# Patient Record
Sex: Female | Born: 1960 | Race: Asian | Hispanic: No | Marital: Married | Smoking: Never smoker
Health system: Southern US, Community
[De-identification: ages and names within clinical notes are randomized; demographics above are authoritative.]

## PROBLEM LIST (undated history)

## (undated) DIAGNOSIS — E785 Hyperlipidemia, unspecified: Secondary | ICD-10-CM

## (undated) DIAGNOSIS — I10 Essential (primary) hypertension: Secondary | ICD-10-CM

## (undated) HISTORY — DX: Essential (primary) hypertension: I10

## (undated) HISTORY — DX: Hyperlipidemia, unspecified: E78.5

## (undated) HISTORY — PX: INNER EAR SURGERY: SHX679

---

## 2009-12-19 ENCOUNTER — Ambulatory Visit: Payer: Self-pay | Admitting: Family Medicine

## 2009-12-19 DIAGNOSIS — I10 Essential (primary) hypertension: Secondary | ICD-10-CM

## 2009-12-19 DIAGNOSIS — E785 Hyperlipidemia, unspecified: Secondary | ICD-10-CM

## 2010-02-07 ENCOUNTER — Ambulatory Visit: Admit: 2010-02-07 | Payer: Self-pay | Admitting: Family Medicine

## 2010-03-06 NOTE — Assessment & Plan Note (Signed)
Summary: NOV:HTN, lipids   Vital Signs:  Patient profile:   50 year old female Height:      67 inches Weight:      187 pounds BMI:     29.39 Pulse rate:   70 / minute BP sitting:   133 / 94  (right arm) Cuff size:   regular  Vitals Entered By: Avon Gully CMA, Duncan Dull) (December 19, 2009 1:52 PM) CC: NP-est care, Hypertension Management   CC:  NP-est care and Hypertension Management.  History of Present Illness: Here to estab care and f/u on her HTN. Says she had labwork done about 2-3 months ago. Usually gets it every 6 mohths. Usually goes to [facility] for her care.   Hypertension History:      She denies headache, chest pain, palpitations, dyspnea with exertion, orthopnea, PND, peripheral edema, visual symptoms, neurologic problems, syncope, and side effects from treatment.  She notes no problems with any antihypertensive medication side effects.        Positive major cardiovascular risk factors include hyperlipidemia and hypertension.  Negative major cardiovascular risk factors include female age less than 78 years old and non-tobacco-user status.     Habits & Providers  Alcohol-Tobacco-Diet     Alcohol drinks/day: 0     Tobacco Status: never  Exercise-Depression-Behavior     Drug Use: no     Seat Belt Use: always  Current Medications (verified): 1)  Simvastatin 20 Mg Tabs (Simvastatin) .... Take One Tablet By Mouth Once A Day 2)  Lisinopril-Hydrochlorothiazide 20-12.5 Mg Tabs (Lisinopril-Hydrochlorothiazide) .... Take One Tablet By Mouth Once A Day  Allergies (verified): No Known Drug Allergies  Comments:  Nurse/Medical Assistant: The patient's medications and allergies were reviewed with the patient and were updated in the Medication and Allergy Lists. Avon Gully CMA, Duncan Dull) (December 19, 2009 1:55 PM)  Past History:  Past Medical History: None  Past Surgical History: None  Family History: Momwith HTN.   Social History: Never  Smoked Alcohol use-no Drug use-no Smoking Status:  never Drug Use:  no Seat Belt Use:  always  Review of Systems       No fever/sweats/weakness, unexplained weight loss/gain.  No vison changes.  No difficulty hearing/ringing in ears, hay fever/allergies.  No chest pain/discomfort, palpitations.  No Br lump/nipple discharge.  No cough/wheeze.  No blood in BM, nausea/vomiting/diarrhea.  No nighttime urination, leaking urine, unusual vaginal bleeding, discharge (penis or vagina).  No muscle/joint pain. No rash, change in mole.  No HA, memory loss.  No anxiety, sleep d/o, depression.  No easy bruising/bleeding, unexplained lump   Contraindications/Deferment of Procedures/Staging:    Treatment: Flu Shot    Contraindication: adverse rxn/side effects   Physical Exam  General:  Well-developed,well-nourished,in no acute distress; alert,appropriate and cooperative throughout examination Head:  Normocephalic and atraumatic without obvious abnormalities. No apparent alopecia or balding. Neck:  No deformities, masses, or tenderness noted. NO T.  Lungs:  Normal respiratory effort, chest expands symmetrically. Lungs are clear to auscultation, no crackles or wheezes. Heart:  Normal rate and regular rhythm. S1 and S2 normal without gallop, murmur, click, rub or other extra sounds. No carotid bruits.  Pulses:  RAdial 2+  Extremities:  NO Le edema.  Skin:  no rashes.   Cervical Nodes:  No lymphadenopathy noted Psych:  Cognition and judgment appear intact. Alert and cooperative with normal attention span and concentration. No apparent delusions, illusions, hallucinations   Impression & Recommendations:  Problem # 1:  HYPERTENSION, BENIGN (ICD-401.1) AT goal  today.  Her updated medication list for this problem includes:    Lisinopril-hydrochlorothiazide 20-12.5 Mg Tabs (Lisinopril-hydrochlorothiazide) .Marland Kitchen... Take one tablet by mouth once a day  Orders: T-Comprehensive Metabolic Panel  ([id number])  BP today: 133/94  10 Yr Risk Heart Disease: Not enough information  Problem # 2:  HYPERLIPIDEMIA (ICD-272.4) Tolerating well. Can get labs at f/u for CPE in [place].  Her updated medication list for this problem includes:    Simvastatin 20 Mg Tabs (Simvastatin) .Marland Kitchen... Take one tablet by mouth once a day  Orders: T-Lipid Profile ([id number])  Complete Medication List: 1)  Simvastatin 20 Mg Tabs (Simvastatin) .... Take one tablet by mouth once a day 2)  Lisinopril-hydrochlorothiazide 20-12.5 Mg Tabs (Lisinopril-hydrochlorothiazide) .... Take one tablet by mouth once a day  Other Orders: Flu Vaccine 80yrs + (62952) Admin 1st Vaccine (84132)  Hypertension Assessment/Plan:      The patient's hypertensive risk group is category B: At least one risk factor (excluding diabetes) with no target organ damage.  Today's blood pressure is 133/94.  Her blood pressure goal is < 140/90.  Patient Instructions: 1)  Please schedule a physical and pap smear in January. We will do fasting labs at that time.  You were given a Tdap today.    Orders Added: 1)  T-Comprehensive Metabolic Panel [[id number]] 2)  T-Lipid Profile [[id number]] 3)  New Patient Level III [44010] 4)  Flu Vaccine 39yrs + [90658] 5)  Admin 1st Vaccine [90471]   Immunizations Administered:  Influenza Vaccine # 1:    Vaccine Type: Fluvax 3+    Site: left deltoid    Mfr: GlaxoSmithKline    Dose: 0.5 ml    Route: IM    Given by: Sue Lush McCrimmon CMA, (AAMA)    Exp. Date: 08/04/2010    Lot #: [id number]    VIS given: 08/29/09 version given December 20, 2009.  Flu Vaccine Consent Questions:    Do you have a history of severe allergic reactions to this vaccine? no    Any prior history of allergic reactions to egg and/or gelatin? no    Do you have a sensitivity to the preservative Thimersol? no    Do you have a past history of Guillan-Barre Syndrome? no    Do you currently have an acute febrile illness? no     Have you ever had a severe reaction to latex? no    Vaccine information given and explained to patient? no    Are you currently pregnant? no   Immunizations Administered:  Influenza Vaccine # 1:    Vaccine Type: Fluvax 3+    Site: left deltoid    Mfr: GlaxoSmithKline    Dose: 0.5 ml    Route: IM    Given by: Sue Lush McCrimmon CMA, (AAMA)    Exp. Date: 08/04/2010    Lot #: [id number]    VIS given: 08/29/09 version given December 20, 2009.  Flu Vaccine Next Due:  Not Indicated Mammogram Result Date:  02/19/2009 Mammogram Result:  normal Mammogram Next Due:  1 yr

## 2012-01-27 LAB — BASIC METABOLIC PANEL
Creatinine: 0.7 mg/dL (ref 0.5–1.1)
Glucose: 87 mg/dL
Potassium: 4.2 mmol/L (ref 3.4–5.3)

## 2012-01-27 LAB — CBC AND DIFFERENTIAL: WBC: 7.1 10^3/mL

## 2012-01-27 LAB — HEPATIC FUNCTION PANEL
ALT: 18 U/L (ref 7–35)
AST: 19 U/L (ref 13–35)

## 2012-03-02 ENCOUNTER — Encounter: Payer: Self-pay | Admitting: Physician Assistant

## 2012-03-02 ENCOUNTER — Ambulatory Visit (INDEPENDENT_AMBULATORY_CARE_PROVIDER_SITE_OTHER): Payer: 59 | Admitting: Physician Assistant

## 2012-03-02 VITALS — BP 118/74 | HR 77 | Resp 16 | Ht 66.5 in | Wt 192.0 lb

## 2012-03-02 DIAGNOSIS — T464X5A Adverse effect of angiotensin-converting-enzyme inhibitors, initial encounter: Secondary | ICD-10-CM

## 2012-03-02 DIAGNOSIS — R05 Cough: Secondary | ICD-10-CM

## 2012-03-02 DIAGNOSIS — Z0000 Encounter for general adult medical examination without abnormal findings: Secondary | ICD-10-CM

## 2012-03-02 DIAGNOSIS — E785 Hyperlipidemia, unspecified: Secondary | ICD-10-CM

## 2012-03-02 MED ORDER — LOSARTAN POTASSIUM-HCTZ 50-12.5 MG PO TABS: 1.0000 | ORAL_TABLET | Freq: Every day | ORAL | Status: DC

## 2012-03-02 NOTE — Patient Instructions (Addendum)
Start new blood pressure medication. Follow up in 3 weeks to recheck BP and make sure cough has gone away.   Will call with stress test appt.  1.5 Gram Low Sodium Diet A 1.5 gram sodium diet restricts the amount of sodium in the diet to no more than 1.5 g or 1500 mg daily. The American Heart Association recommends Americans over the age of 49 to consume no more than 1500 mg of sodium each day to reduce the risk of developing high blood pressure. Research also shows that limiting sodium may reduce heart attack and stroke risk. Many foods contain sodium for flavor and sometimes as a preservative. When the amount of sodium in a diet needs to be low, it is important to know what to look for when choosing foods and drinks. The following includes some information and guidelines to help make it easier for you to adapt to a low sodium diet. QUICK TIPS  Do not add salt to food.  Avoid convenience items and fast food.  Choose unsalted snack foods.  Buy lower sodium products, often labeled as "lower sodium" or "no salt added."  Check food labels to learn how much sodium is in 1 serving.  When eating at a restaurant, ask that your food be prepared with less salt or none, if possible. READING FOOD LABELS FOR SODIUM INFORMATION The nutrition facts label is a good place to find how much sodium is in foods. Look for products with no more than 400 mg of sodium per serving. Remember that 1.5 g = 1500 mg. The food label may also list foods as:  Sodium-free: Less than 5 mg in a serving.  Very low sodium: 35 mg or less in a serving.  Low-sodium: 140 mg or less in a serving.  Light in sodium: 50% less sodium in a serving. For example, if a food that usually has 300 mg of sodium is changed to become light in sodium, it will have 150 mg of sodium.  Reduced sodium: 25% less sodium in a serving. For example, if a food that usually has 400 mg of sodium is changed to reduced sodium, it will have 300 mg of  sodium. CHOOSING FOODS Grains  Avoid: Salted crackers and snack items. Some cereals, including instant hot cereals. Bread stuffing and biscuit mixes. Seasoned rice or pasta mixes.  Choose: Unsalted snack items. Low-sodium cereals, oats, puffed wheat and rice, shredded wheat. English muffins and bread. Pasta. Meats  Avoid: Salted, canned, smoked, spiced, pickled meats, including fish and poultry. Bacon, ham, sausage, cold cuts, hot dogs, anchovies.  Choose: Low-sodium canned tuna and salmon. Fresh or frozen meat, poultry, and fish. Dairy  Avoid: Processed cheese and spreads. Cottage cheese. Buttermilk and condensed milk. Regular cheese.  Choose: Milk. Low-sodium cottage cheese. Yogurt. Sour cream. Low-sodium cheese. Fruits and Vegetables  Avoid: Regular canned vegetables. Regular canned tomato sauce and paste. Frozen vegetables in sauces. Olives. Rosita Fire. Relishes. Sauerkraut.  Choose: Low-sodium canned vegetables. Low-sodium tomato sauce and paste. Frozen or fresh vegetables. Fresh and frozen fruit. Condiments  Avoid: Canned and packaged gravies. Worcestershire sauce. Tartar sauce. Barbecue sauce. Soy sauce. Steak sauce. Ketchup. Onion, garlic, and table salt. Meat flavorings and tenderizers.  Choose: Fresh and dried herbs and spices. Low-sodium varieties of mustard and ketchup. Lemon juice. Tabasco sauce. Horseradish. SAMPLE 1.5 GRAM SODIUM MEAL PLAN Breakfast / Sodium (mg)  1 cup low-fat milk / 143 mg  1 whole-wheat English muffin / 240 mg  1 tbs heart-healthy margarine / 153  mg  1 hard-boiled egg / 139 mg  1 small orange / 0 mg Lunch / Sodium (mg)  1 cup raw carrots / 76 mg  2 tbs no salt added peanut butter / 5 mg  2 slices whole-wheat bread / 270 mg  1 tbs jelly / 6 mg   cup red grapes / 2 mg Dinner / Sodium (mg)  1 cup whole-wheat pasta / 2 mg  1 cup low-sodium tomato sauce / 73 mg  3 oz lean ground beef / 57 mg  1 small side salad (1 cup raw spinach  leaves,  cup cucumber,  cup yellow bell pepper) with 1 tsp olive oil and 1 tsp red wine vinegar / 25 mg Snack / Sodium (mg)  1 container low-fat vanilla yogurt / 107 mg  3 graham cracker squares / 127 mg Nutrient Analysis  Calories: 1745  Protein: 75 g  Carbohydrate: 237 g  Fat: 57 g  Sodium: 1425 mg Document Released: 01/21/2005 Document Revised: 04/15/2011 Document Reviewed: 04/24/2009 [facility] Patient Information 2013 [place], [place].

## 2012-03-02 NOTE — Progress Notes (Signed)
  Subjective:    Patient ID: Kelly Medina, female    DOB: 1960-05-05, 52 y.o.   MRN: [id number]  HPI Patient is a 52 year old female who presents to the clinic to establish care today. Past medical history was reviewed and positive for hypertension and hyperlipidemia.  Hypertension is ongoing but well controlled on Lisinopril/HCTZ. She has a ongoing cough for over a year but never mentioned it to anybody until today. Very dry not productive. She denies any CP, palpitations, HA's, vision changes.   Hyperlipidemia is ongoing and per patient controlled on Zocor. She had her blood drawn 2 weeks ago at [facility]. She tries to keep a low fat diet. She denies any current exercise.   Today she would like to be released for exercise. She denies any chest tightness, numbness and tingling down arms. [employer] is requesting free stress test. She would like to get.  Pap smear done 2 weeks ago. Mammogram scheduled for March 2014. Colonoscopy done 3 years ago and due every 5 years. All vaccines up to date.    Review of Systems     Objective:   Physical Exam  Constitutional: She is oriented to person, place, and time. She appears well-developed and well-nourished.  HENT:  Head: Normocephalic and atraumatic.  Cardiovascular: Normal rate, regular rhythm and normal heart sounds.   Pulmonary/Chest: Effort normal and breath sounds normal. She has no wheezes.  Neurological: She is alert and oriented to person, place, and time.  Skin: Skin is warm and dry.  Psychiatric: She has a normal mood and affect. Her behavior is normal.          Assessment & Plan:  Cough- I suspect the cough is coming as a result of lisinopril. Patient was instructed to stop lisinopril HCTZ and start Hyzaar once daily. Will recheck blood pressure in 2-3 weeks as well as resolution of cough. Patient reminded of low salt diet.  Hyperlipidemia-we'll wait to get records to confirm well controlled. In the meantime continue on Zocor.  Patient  is requesting stress test [employer] will pay for one particular stress test. Ordered stress test.

## 2012-03-18 ENCOUNTER — Encounter: Payer: 59 | Admitting: Physician Assistant

## 2012-03-27 ENCOUNTER — Encounter: Payer: Self-pay | Admitting: Physician Assistant

## 2012-03-27 ENCOUNTER — Ambulatory Visit (INDEPENDENT_AMBULATORY_CARE_PROVIDER_SITE_OTHER): Payer: 59 | Admitting: Physician Assistant

## 2012-03-27 VITALS — BP 113/74 | HR 77 | Wt 194.0 lb

## 2012-03-27 DIAGNOSIS — Z0189 Encounter for other specified special examinations: Secondary | ICD-10-CM

## 2012-03-27 DIAGNOSIS — R319 Hematuria, unspecified: Secondary | ICD-10-CM

## 2012-03-27 LAB — POCT URINALYSIS DIPSTICK
Bilirubin, UA: NEGATIVE
Ketones, UA: NEGATIVE
Leukocytes, UA: NEGATIVE
Spec Grav, UA: 1.025

## 2012-03-27 NOTE — Progress Notes (Signed)
  Subjective:    Patient ID: Kelly Medina, female    DOB: 1960/04/06, 52 y.o.   MRN: [id number]  HPI Patient comes to clinic to have paperwork filled out so that she can start exercising with [employer]ness program. No problems or concerns today.    Review of Systems  All other systems reviewed and are negative.       Objective:   Physical Exam  Constitutional: She is oriented to person, place, and time. She appears well-developed and well-nourished.  HENT:  Head: Normocephalic and atraumatic.  Cardiovascular: Normal rate, regular rhythm and normal heart sounds.   Pulmonary/Chest: Effort normal and breath sounds normal.  Neurological: She is alert and oriented to person, place, and time.  Skin: Skin is warm and dry.  Psychiatric: She has a normal mood and affect. Her behavior is normal.          Assessment & Plan:  Form to fill out/hematuria- UA found some blood in urine. Discussed need to follow up in 1 month. Denies any symptoms. I want to make sure resolves. Gave lab slip to get fasting bloodwork and labs requested by [facility]. Pt feels like she has had these done and is going to see if OB/GYN has done them if so will give [place] a copy. Will send stress test after completed.

## 2012-03-27 NOTE — Patient Instructions (Signed)
Need to get labs from other office or get labs drawn here to send into clinic. We would like copy for ourselves.

## 2012-04-03 ENCOUNTER — Ambulatory Visit (INDEPENDENT_AMBULATORY_CARE_PROVIDER_SITE_OTHER): Payer: 59 | Admitting: Physician Assistant

## 2012-04-03 NOTE — Procedures (Signed)
Kelly Medina is a 52 y.o. female with hx of HTN, HL for ETT.  She wants to start exercise program. No hx of tobacco. No FHx of CAD.  No hx of CP, SOB, syncope.  Exam unremarkable.   Exercise Treadmill Test  Pre-Exercise Testing Evaluation Rhythm: normal sinus  Rate: 101     Test  Exercise Tolerance Test Ordering MD: Nani Gasser MD  Interpreting MD: Tereso Newcomer PA-C  Unique Test No: 1  Treadmill:  1  Indication for ETT: htn  Contraindication to ETT: No   Stress Modality: exercise - treadmill  Cardiac Imaging Performed: non   Protocol: standard Bruce - maximal  Max BP:  165/103  Max MPHR (bpm):  168 85% MPR (bpm):  143  MPHR obtained (bpm):  166 % MPHR obtained:  99%  Reached 85% MPHR (min:sec):  6:15 Total Exercise Time (min-sec):  9:00  Workload in METS:  10.1 Borg Scale: 15  Reason ETT Terminated:  desired heart rate attained    ST Segment Analysis At Rest: non-specific ST segment slurring With Exercise: non-specific ST changes  Other Information Arrhythmia:  No Angina during ETT:  absent (0) Quality of ETT:  diagnostic  ETT Interpretation:  normal - no evidence of ischemia by ST analysis  Comments: Good exercise tolerance. No chest pain. Hypertensive BP response to exercise. No significant ST-T changes to suggest ischemia. Good HR recovery in 1st minute post exercise (HR dropped > 12 bpm at 2 mph on 2% grade).  Recommendations: Follow up with PCP as planned. Luna Glasgow, PA-C  9:39 AM 04/03/2012

## 2012-04-07 ENCOUNTER — Encounter: Payer: Self-pay | Admitting: *Deleted

## 2012-04-27 ENCOUNTER — Encounter: Payer: Self-pay | Admitting: *Deleted

## 2012-05-06 ENCOUNTER — Telehealth: Payer: Self-pay | Admitting: Physician Assistant

## 2012-05-06 ENCOUNTER — Ambulatory Visit (INDEPENDENT_AMBULATORY_CARE_PROVIDER_SITE_OTHER): Payer: 59 | Admitting: Physician Assistant

## 2012-05-06 ENCOUNTER — Encounter: Payer: Self-pay | Admitting: Physician Assistant

## 2012-05-06 VITALS — BP 136/96 | HR 71 | Wt 191.0 lb

## 2012-05-06 DIAGNOSIS — I10 Essential (primary) hypertension: Secondary | ICD-10-CM

## 2012-05-06 DIAGNOSIS — E785 Hyperlipidemia, unspecified: Secondary | ICD-10-CM

## 2012-05-06 DIAGNOSIS — R928 Other abnormal and inconclusive findings on diagnostic imaging of breast: Secondary | ICD-10-CM

## 2012-05-06 DIAGNOSIS — R319 Hematuria, unspecified: Secondary | ICD-10-CM

## 2012-05-06 LAB — CBC WITH DIFFERENTIAL/PLATELET
Eosinophils Absolute: 0.1 10*3/uL (ref 0.0–0.7)
HCT: 40.9 % (ref 36.0–46.0)
Hemoglobin: 14.1 g/dL (ref 12.0–15.0)
Lymphs Abs: 1.8 10*3/uL (ref 0.7–4.0)
MCH: 29.7 pg (ref 26.0–34.0)
Monocytes Absolute: 0.3 10*3/uL (ref 0.1–1.0)
Monocytes Relative: 5 % (ref 3–12)
Neutro Abs: 3.5 10*3/uL (ref 1.7–7.7)
Neutrophils Relative %: 63 % (ref 43–77)
RBC: 4.74 MIL/uL (ref 3.87–5.11)

## 2012-05-06 LAB — COMPREHENSIVE METABOLIC PANEL
ALT: 23 U/L (ref 0–35)
AST: 20 U/L (ref 0–37)
Alkaline Phosphatase: 65 U/L (ref 39–117)
BUN: 23 mg/dL (ref 6–23)
Calcium: 9.3 mg/dL (ref 8.4–10.5)
Chloride: 107 mEq/L (ref 96–112)
Creat: 0.76 mg/dL (ref 0.50–1.10)
Total Bilirubin: 0.5 mg/dL (ref 0.3–1.2)

## 2012-05-06 LAB — LIPID PANEL
Cholesterol: 185 mg/dL (ref 0–200)
HDL: 40 mg/dL (ref >39)
LDL Cholesterol: 123 mg/dL — ABNORMAL HIGH (ref 0–99)
Total CHOL/HDL Ratio: 4.6 Ratio
Triglycerides: 112 mg/dL (ref <150)
VLDL: 22 mg/dL (ref 0–40)

## 2012-05-06 LAB — POCT URINALYSIS DIPSTICK
Glucose, UA: NEGATIVE
Leukocytes, UA: NEGATIVE
Nitrite, UA: NEGATIVE
Urobilinogen, UA: 0.2

## 2012-05-06 NOTE — Telephone Encounter (Signed)
Please call pt: got results of breast ultrasound. Impression was suggestive of sebaceous cyst. If continues to enlarge or become painful can undergo surgical excision. If not bothering you then will continue with yearly screening mammograms.

## 2012-05-06 NOTE — Progress Notes (Signed)
  Subjective:    Patient ID: Kelly Medina, female    DOB: 1960/08/17, 52 y.o.   MRN: [id number]  HPI Patient presents to clinic to follow up on hematuria that was found one month ago. Denies any symptoms of dysuria, urgency, frequency.   Hypertension is controlled. Did not take meds this morning. Takes medication regularly. Denies any CP, palpitations, vision changes, HA's. Has not started exercise plan at [facility] yet. Plans to soon. Sticks to a low salt diet.  Hyperlipidemia uncontrolled currently. Started medication. Needs recheck. Trying to adhere to low fat diet.   Had abnormal right breast mammogram and ultrasound. Pt is very unsure of next step. She has not heard from imaging. She wonders about need for biopsy. Denies any pain or symptoms.   Review of Systems     Objective:   Physical Exam  Constitutional: She is oriented to person, place, and time. She appears well-developed and well-nourished.  HENT:  Head: Normocephalic and atraumatic.  Cardiovascular: Normal rate, regular rhythm and normal heart sounds.   Pulmonary/Chest: Effort normal and breath sounds normal.  Neurological: She is alert and oriented to person, place, and time.  Skin: Skin is warm and dry.  Psychiatric: She has a normal mood and affect. Her behavior is normal.          Assessment & Plan:  Hematuria- Rechecked Urine and was negative. Reassured pt that normal variant.   Hyperlipidemia- Needs refill on zocor. I want to get labs before refill. Discussed continue low fat diet.   HTN- Pt reports does not need refill. She was not taking medication today since fasting for bloodwork. Reminded of low salt diet. Follow up in 6 months.   Abnormal mammogram- right breast abnormality. Pt had follow up with ultrasound and she had done. She questioned biospy. We will call imaging center and see if recommend biopsy.

## 2012-05-06 NOTE — Telephone Encounter (Signed)
Pt notified & voiced understanding.

## 2012-05-06 NOTE — Patient Instructions (Signed)
Will call and see what imaging of breast [facility] recommends.   Will call with results and refill medication at that time.

## 2012-05-07 ENCOUNTER — Other Ambulatory Visit: Payer: Self-pay | Admitting: Physician Assistant

## 2012-05-07 ENCOUNTER — Encounter: Payer: Self-pay | Admitting: *Deleted

## 2012-05-07 MED ORDER — SIMVASTATIN 40 MG PO TABS: 40.0000 mg | ORAL_TABLET | Freq: Every evening | ORAL | Status: DC

## 2012-11-06 ENCOUNTER — Other Ambulatory Visit: Payer: Self-pay | Admitting: Physician Assistant

## 2012-11-09 ENCOUNTER — Other Ambulatory Visit: Payer: Self-pay | Admitting: Physician Assistant

## 2012-12-07 ENCOUNTER — Ambulatory Visit (INDEPENDENT_AMBULATORY_CARE_PROVIDER_SITE_OTHER): Payer: 59 | Admitting: Physician Assistant

## 2012-12-07 ENCOUNTER — Encounter: Payer: Self-pay | Admitting: Physician Assistant

## 2012-12-07 VITALS — BP 143/90 | HR 64 | Wt 192.0 lb

## 2012-12-07 DIAGNOSIS — Z780 Asymptomatic menopausal state: Secondary | ICD-10-CM

## 2012-12-07 DIAGNOSIS — Z131 Encounter for screening for diabetes mellitus: Secondary | ICD-10-CM

## 2012-12-07 DIAGNOSIS — Z13820 Encounter for screening for osteoporosis: Secondary | ICD-10-CM

## 2012-12-07 DIAGNOSIS — Z0000 Encounter for general adult medical examination without abnormal findings: Secondary | ICD-10-CM

## 2012-12-07 DIAGNOSIS — I10 Essential (primary) hypertension: Secondary | ICD-10-CM

## 2012-12-07 DIAGNOSIS — E785 Hyperlipidemia, unspecified: Secondary | ICD-10-CM

## 2012-12-07 LAB — COMPLETE METABOLIC PANEL WITH GFR
ALT: 19 U/L (ref 0–35)
AST: 18 U/L (ref 0–37)
Albumin: 4.6 g/dL (ref 3.5–5.2)
Alkaline Phosphatase: 84 U/L (ref 39–117)
CO2: 28 mEq/L (ref 19–32)
Creat: 0.68 mg/dL (ref 0.50–1.10)
GFR, Est Non African American: >89 mL/min
Glucose, Bld: 83 mg/dL (ref 70–99)
Potassium: 4 mEq/L (ref 3.5–5.3)
Sodium: 142 mEq/L (ref 135–145)
Total Protein: 7.8 g/dL (ref 6.0–8.3)

## 2012-12-07 LAB — LIPID PANEL
Cholesterol: 202 mg/dL — ABNORMAL HIGH (ref 0–200)
HDL: 52 mg/dL (ref >39)
LDL Cholesterol: 134 mg/dL — ABNORMAL HIGH (ref 0–99)
Total CHOL/HDL Ratio: 3.9 Ratio
VLDL: 16 mg/dL (ref 0–40)

## 2012-12-07 MED ORDER — LOSARTAN POTASSIUM-HCTZ 50-12.5 MG PO TABS: 1.0000 | ORAL_TABLET | Freq: Every day | ORAL | Status: DC

## 2012-12-07 MED ORDER — SIMVASTATIN 40 MG PO TABS: 40.0000 mg | ORAL_TABLET | Freq: Every evening | ORAL | Status: DC

## 2012-12-07 NOTE — Progress Notes (Signed)
  Subjective:     Kelly Medina is a 52 y.o. female and is here for a comprehensive physical exam. The patient reports no problems.  History   Social History  . Marital Status: Married    Spouse Name: N/A    Number of Children: N/A  . Years of Education: N/A   Occupational History  . Not on file.   Social History Main Topics  . Smoking status: Never Smoker   . Smokeless tobacco: Never Used  . Alcohol Use: No  . Drug Use: No  . Sexual Activity:    Other Topics Concern  . Not on file   Social History Narrative  . No narrative on file   Health Maintenance  Topic Date Due  . Influenza Vaccine  09/04/2012  . Colonoscopy  03/02/2014  . Mammogram  04/15/2014  . Pap Smear  02/17/2015  . Tetanus/tdap  03/02/2018    The following portions of the patient's history were reviewed and updated as appropriate: allergies, current medications, past family history, past medical history, past social history, past surgical history and problem list.  Review of Systems A comprehensive review of systems was negative.   Objective:    BP 143/90  Pulse 64  Wt 192 lb (87.091 kg) General appearance: alert, cooperative and appears stated age Head: Normocephalic, without obvious abnormality, atraumatic Eyes: conjunctivae/corneas clear. PERRL, EOM's intact. Fundi benign. Ears: normal TM's and external ear canals both ears Nose: Nares normal. Septum midline. Mucosa normal. No drainage or sinus tenderness. Throat: lips, mucosa, and tongue normal; teeth and gums normal Neck: no adenopathy, no carotid bruit, no JVD, supple, symmetrical, trachea midline and thyroid not enlarged, symmetric, no tenderness/mass/nodules Back: symmetric, no curvature. ROM normal. No CVA tenderness. Lungs: clear to auscultation bilaterally Heart: regular rate and rhythm, S1, S2 normal, no murmur, click, rub or gallop Abdomen: soft, non-tender; bowel sounds normal; no masses,  no organomegaly Extremities: extremities  normal, atraumatic, no cyanosis or edema Pulses: 2+ and symmetric Skin: Skin color, texture, turgor normal. No rashes or lesions Lymph nodes: Cervical, supraclavicular, and axillary nodes normal. Neurologic: Grossly normal    Assessment:    Healthy female exam.      Plan:    CPE for [employer] plan- check fasting lipids and cholesterol today. Patient has lost only 2 pounds since starting regimen 6 months ago. Patient is aware that she has to work out more than she currently is. We set goals to day of working out 3-4 times a week, low-fat diet. Patient request a bone density scan today. I discussed with patient that is not currently indicated until she 79 if she has no other caustics issues. Pt still wanted. Will order.filled out paperwork today. Fall risk low. Depression PHQ-9 was 0 out of 2.   Refilled zocor and hyzzar.  Pt declined flu shot.  See After Visit Summary for Counseling Recommendations

## 2013-07-09 ENCOUNTER — Ambulatory Visit (INDEPENDENT_AMBULATORY_CARE_PROVIDER_SITE_OTHER): Payer: Commercial Managed Care - PPO | Admitting: Physician Assistant

## 2013-07-09 ENCOUNTER — Ambulatory Visit (INDEPENDENT_AMBULATORY_CARE_PROVIDER_SITE_OTHER): Payer: Commercial Managed Care - PPO

## 2013-07-09 ENCOUNTER — Encounter: Payer: Self-pay | Admitting: Physician Assistant

## 2013-07-09 ENCOUNTER — Other Ambulatory Visit: Payer: Self-pay | Admitting: Physician Assistant

## 2013-07-09 VITALS — BP 132/91 | HR 67 | Ht 67.0 in | Wt 193.0 lb

## 2013-07-09 DIAGNOSIS — M20009 Unspecified deformity of unspecified finger(s): Secondary | ICD-10-CM

## 2013-07-09 DIAGNOSIS — R635 Abnormal weight gain: Secondary | ICD-10-CM

## 2013-07-09 DIAGNOSIS — Z09 Encounter for follow-up examination after completed treatment for conditions other than malignant neoplasm: Secondary | ICD-10-CM

## 2013-07-09 DIAGNOSIS — M898X9 Other specified disorders of bone, unspecified site: Secondary | ICD-10-CM

## 2013-07-09 DIAGNOSIS — E669 Obesity, unspecified: Secondary | ICD-10-CM

## 2013-07-09 DIAGNOSIS — E785 Hyperlipidemia, unspecified: Secondary | ICD-10-CM

## 2013-07-09 DIAGNOSIS — Z1231 Encounter for screening mammogram for malignant neoplasm of breast: Secondary | ICD-10-CM

## 2013-07-09 DIAGNOSIS — M159 Polyosteoarthritis, unspecified: Secondary | ICD-10-CM

## 2013-07-09 DIAGNOSIS — M19049 Primary osteoarthritis, unspecified hand: Secondary | ICD-10-CM

## 2013-07-09 LAB — LIPID PANEL
Cholesterol: 181 mg/dL (ref 0–200)
HDL: 41 mg/dL (ref >39)
LDL CALC: 120 mg/dL — AB (ref 0–99)
Total CHOL/HDL Ratio: 4.4 Ratio
Triglycerides: 100 mg/dL (ref <150)
VLDL: 20 mg/dL (ref 0–40)

## 2013-07-09 LAB — GLUCOSE, RANDOM: Glucose, Bld: 79 mg/dL (ref 70–99)

## 2013-07-09 MED ORDER — LOSARTAN POTASSIUM-HCTZ 50-12.5 MG PO TABS: 1.0000 | ORAL_TABLET | Freq: Every day | ORAL | Status: AC

## 2013-07-09 MED ORDER — SIMVASTATIN 40 MG PO TABS: 40.0000 mg | ORAL_TABLET | Freq: Every evening | ORAL | Status: AC

## 2013-07-09 MED ORDER — PHENTERMINE HCL 37.5 MG PO TABS: 37.5000 mg | ORAL_TABLET | Freq: Every day | ORAL | Status: AC

## 2013-07-11 DIAGNOSIS — R635 Abnormal weight gain: Secondary | ICD-10-CM | POA: Insufficient documentation

## 2013-07-11 DIAGNOSIS — E669 Obesity, unspecified: Secondary | ICD-10-CM | POA: Insufficient documentation

## 2013-07-11 DIAGNOSIS — M19049 Primary osteoarthritis, unspecified hand: Secondary | ICD-10-CM | POA: Insufficient documentation

## 2013-07-11 NOTE — Progress Notes (Signed)
   Subjective:    Patient ID: Kelly Medina, female    DOB: 1960/11/23, 53 y.o.   MRN: [id number]  HPI Pt comes in to have 6 month recheck for her fitness regimen at the [facility]. She is trying to lose weight and stay healthy. In order to stay in program has to get follow ups. She is currently up 1 pound. She goes to the gym 2-3 times a week. Admits to not working out as much as she should. She denies making any real diet changes. Would like to try something to help lose weight.   Nodules on bilateral tip of fingers. No treatment. No pain. No discomfort. No Loss of motion.    Review of Systems  All other systems reviewed and are negative.      Objective:   Physical Exam  Constitutional: She is oriented to person, place, and time. She appears well-developed and well-nourished.  HENT:  Head: Normocephalic and atraumatic.  Cardiovascular: Normal rate, regular rhythm and normal heart sounds.   Pulmonary/Chest: Effort normal. She has no wheezes.  Musculoskeletal:  Non tender nodules on bilateral hand DIP joints of all metatarsals.   Neurological: She is alert and oriented to person, place, and time.  Skin: Skin is dry.  Psychiatric: She has a normal mood and affect. Her behavior is normal.          Assessment & Plan:  Obese/abnormal weight gain- ordered labs for [facility] program she is in. Started phentermine. Discussed side effects. Pt aware only as good as diet and exercise along with it. Discussed tracking 1200 calorie diet and increasing exercise to 4 times a week. Follow up in one month. Would like for her to continue in problem.   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 Not painful currently. No treatment. Dicussed keeping joints moving. NSAIDs as needed for pain. Follow up as needed.

## 2013-07-13 ENCOUNTER — Ambulatory Visit: Payer: 59

## 2013-07-15 ENCOUNTER — Ambulatory Visit (INDEPENDENT_AMBULATORY_CARE_PROVIDER_SITE_OTHER): Payer: Commercial Managed Care - PPO

## 2013-07-15 DIAGNOSIS — R928 Other abnormal and inconclusive findings on diagnostic imaging of breast: Secondary | ICD-10-CM

## 2013-07-15 DIAGNOSIS — Z1231 Encounter for screening mammogram for malignant neoplasm of breast: Secondary | ICD-10-CM

## 2013-07-16 ENCOUNTER — Other Ambulatory Visit: Payer: Self-pay | Admitting: Physician Assistant

## 2013-07-16 DIAGNOSIS — R928 Other abnormal and inconclusive findings on diagnostic imaging of breast: Secondary | ICD-10-CM

## 2013-07-19 ENCOUNTER — Other Ambulatory Visit: Payer: Self-pay | Admitting: Physician Assistant

## 2013-07-19 ENCOUNTER — Other Ambulatory Visit: Payer: Self-pay

## 2013-07-19 DIAGNOSIS — R928 Other abnormal and inconclusive findings on diagnostic imaging of breast: Secondary | ICD-10-CM

## 2013-07-23 ENCOUNTER — Other Ambulatory Visit: Payer: Commercial Managed Care - PPO

## 2013-08-16 ENCOUNTER — Telehealth: Payer: Self-pay | Admitting: *Deleted

## 2013-08-16 DIAGNOSIS — R229 Localized swelling, mass and lump, unspecified: Principal | ICD-10-CM

## 2013-08-16 DIAGNOSIS — IMO0002 Reserved for concepts with insufficient information to code with codable children: Secondary | ICD-10-CM

## 2013-08-23 ENCOUNTER — Telehealth: Payer: Self-pay | Admitting: *Deleted

## 2013-08-23 DIAGNOSIS — N6001 Solitary cyst of right breast: Secondary | ICD-10-CM

## 2013-08-23 NOTE — Telephone Encounter (Signed)
lvm asking for Dr. Linford ArnoldMetheney to give order for pt to have 2 cyst aspirations done in the office.Loralee PacasBarkley, Keryn Nessler ValricoLynetta

## 2013-08-24 NOTE — Telephone Encounter (Signed)
[facility] [place] to need order for 2 cyst aspirations and would like for us to call pt to set up appt to have this done

## 2013-08-25 ENCOUNTER — Telehealth: Payer: Self-pay | Admitting: *Deleted

## 2013-08-25 NOTE — Telephone Encounter (Signed)
Order placed called pt @ [phone] lvm informing pt to call their office @[phone]. Called [facility] and lvm informing Marcelino DusterMichelle that I had lvm with pt to contact their office to schedule an appt and if they had any questions to call.Loralee PacasBarkley, Atianna Haidar FranklinLynetta

## 2013-08-25 NOTE — Telephone Encounter (Signed)
Orders faxed.Etheline Geppert Lynetta  

## 2013-09-08 ENCOUNTER — Encounter: Payer: Self-pay | Admitting: Physician Assistant

## 2013-09-16 ENCOUNTER — Encounter: Payer: Self-pay | Admitting: Physician Assistant

## 2013-09-23 ENCOUNTER — Encounter: Payer: Self-pay | Admitting: Family Medicine

## 2015-03-07 IMAGING — CR DG HAND COMPLETE 3+V*R*
3 series · 3 of 3 positions shown · non-contrast
Comparison: None.

CLINICAL DATA: Palpable nodules on DIP joints of fingers.

EXAM:
RIGHT HAND - COMPLETE 3+ VIEW

[view not recorded (1 of 3)]
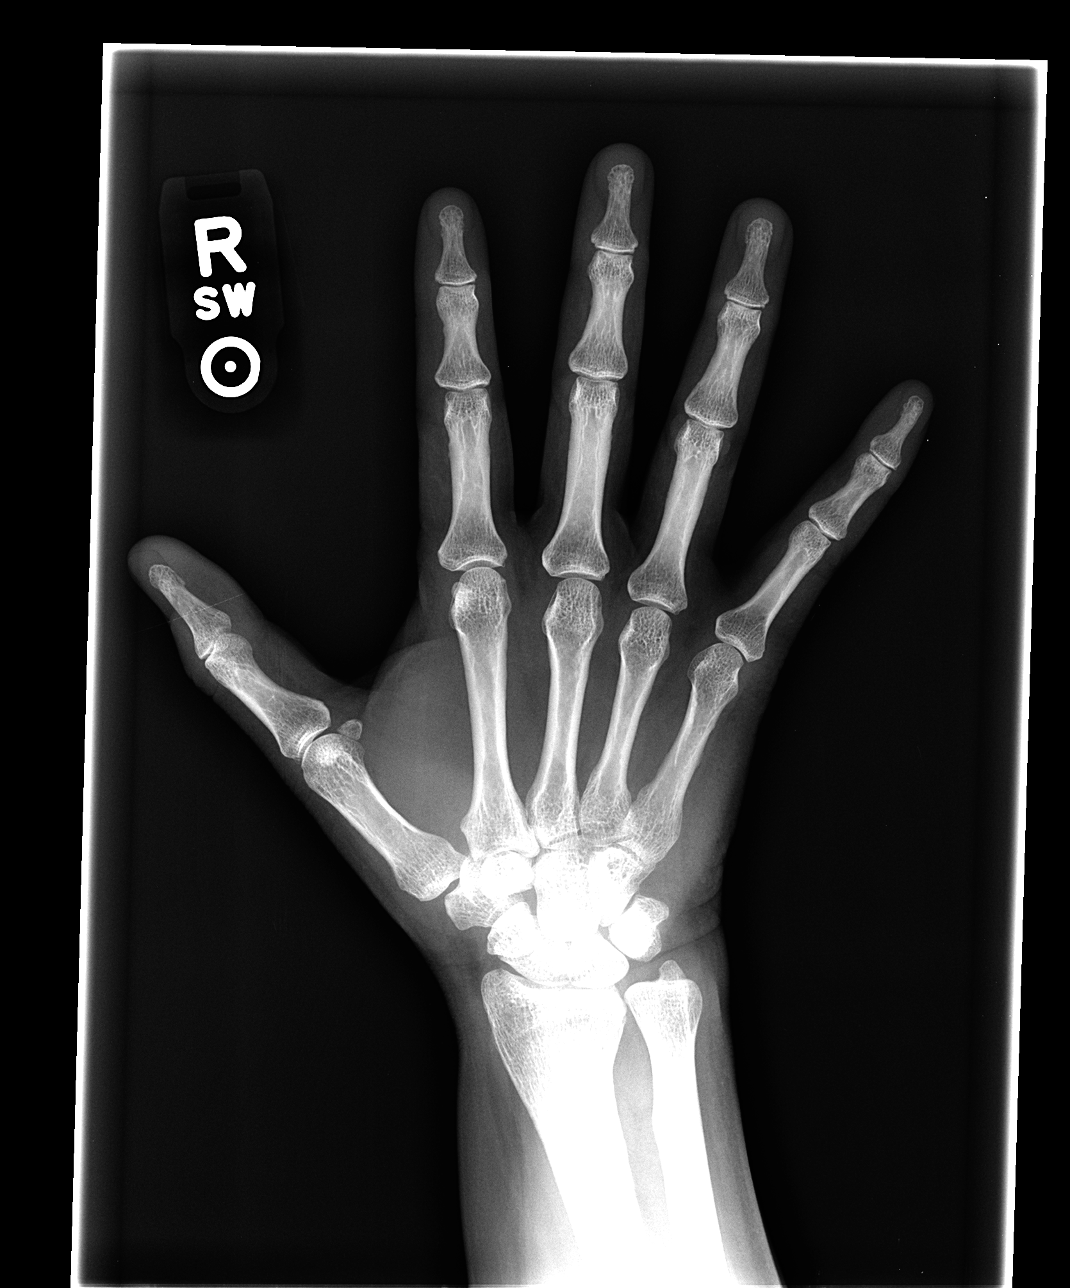

[view not recorded (2 of 3)]
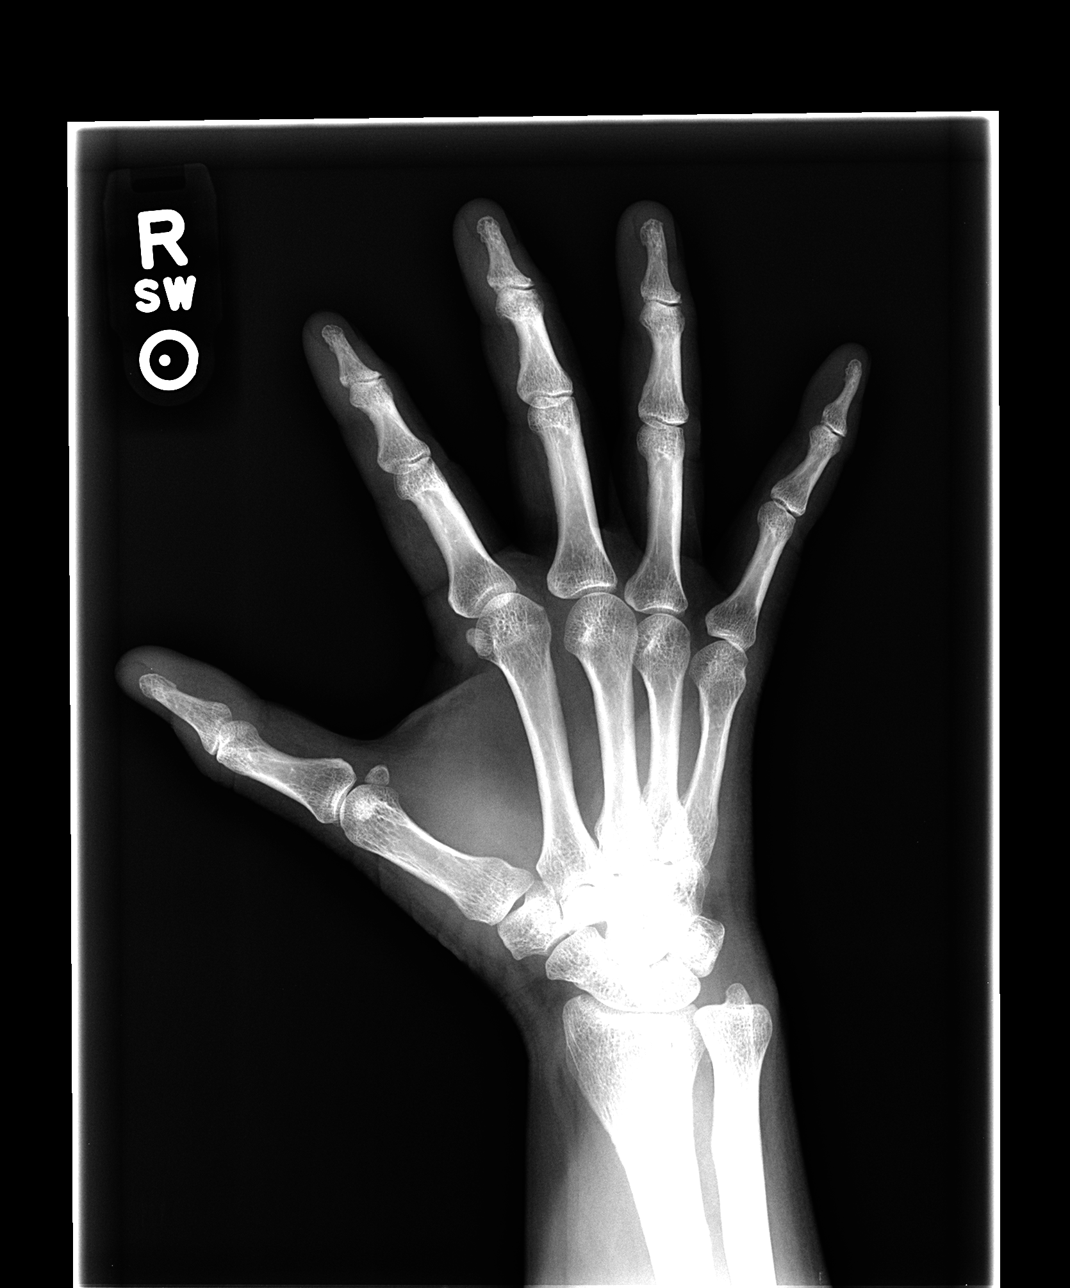

[view not recorded (3 of 3)]
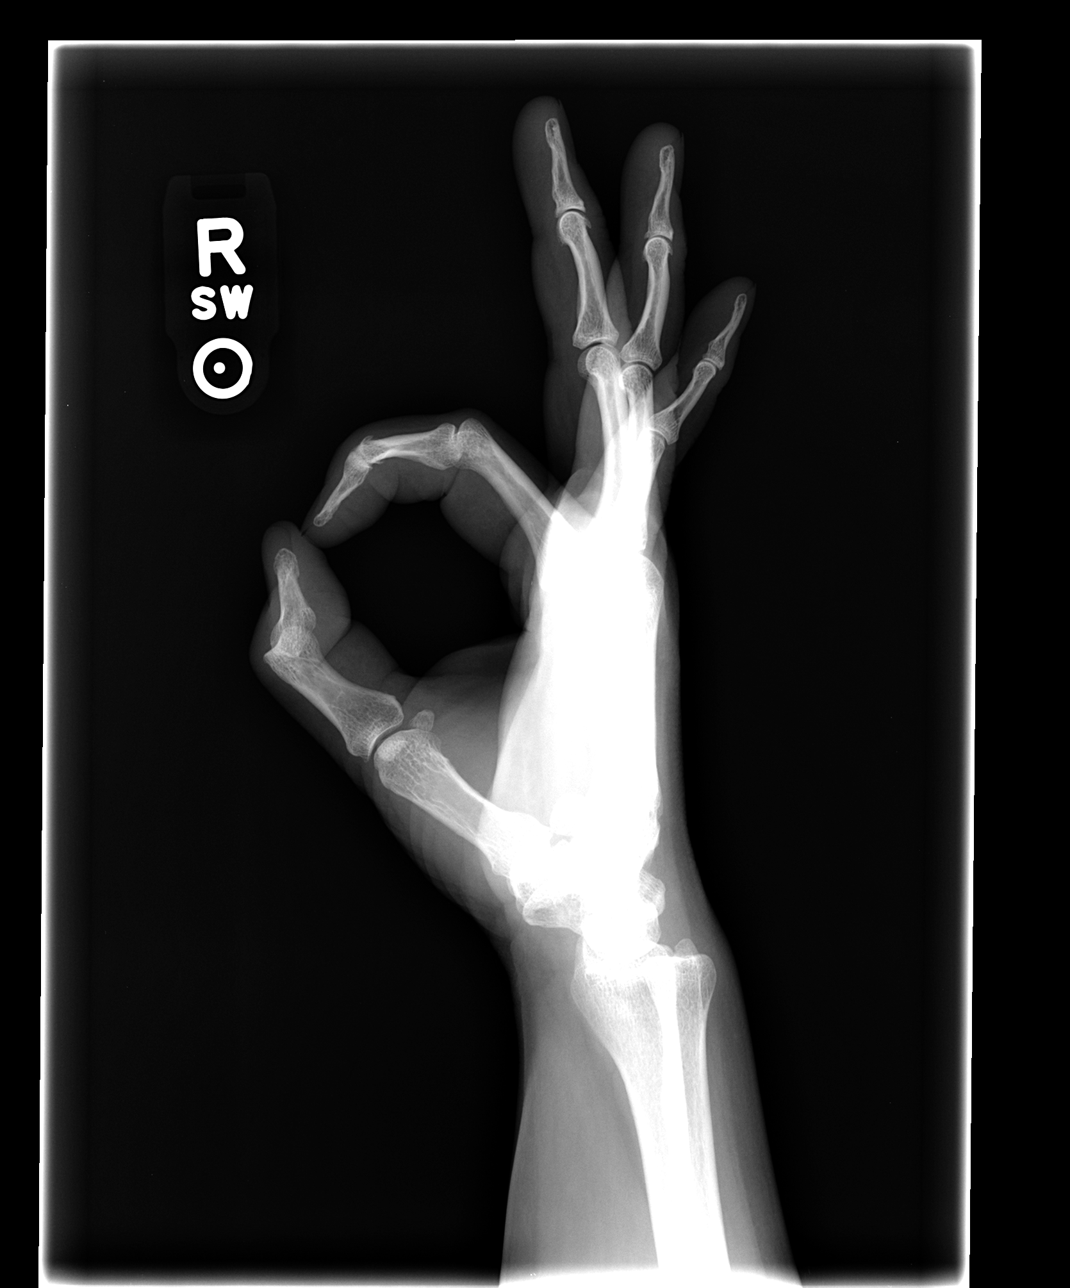

[3 of 3 positions shown; findings below may reference images not displayed]

FINDINGS: There is no evidence of fracture or dislocation.

Mild degenerative spurring is seen involving the distal
interphalangeal joints of the index, middle, and ring fingers. No
other significant bone abnormality identified. Bone mineral density
is normal. Soft tissues are unremarkable.
IMPRESSION: Mild osteoarthritis involving DIP joints of second, third, and
fourth fingers.

## 2021-08-03 ENCOUNTER — Ambulatory Visit (INDEPENDENT_AMBULATORY_CARE_PROVIDER_SITE_OTHER): Payer: BC Managed Care – PPO

## 2021-08-03 ENCOUNTER — Encounter (INDEPENDENT_AMBULATORY_CARE_PROVIDER_SITE_OTHER): Payer: Self-pay

## 2021-08-03 VITALS — BP 116/75 | HR 74 | Temp 98.1°F | Resp 16

## 2021-08-03 DIAGNOSIS — L03011 Cellulitis of right finger: Secondary | ICD-10-CM

## 2021-08-03 MED ORDER — MUPIROCIN 2 % EX OINT
TOPICAL_OINTMENT | Freq: Three times a day (TID) | CUTANEOUS | 0 refills | Status: AC
Start: 2021-08-03 — End: ?

## 2021-08-03 MED ORDER — SULFAMETHOXAZOLE-TRIMETHOPRIM 800-160 MG PO TABS
1.0000 | ORAL_TABLET | Freq: Two times a day (BID) | ORAL | 0 refills | Status: AC
Start: 2021-08-03 — End: 2021-08-10

## 2021-08-03 NOTE — Patient Instructions (Signed)
Please let us know how you are doing with your care.  You will be receiving an email asking you how your care was.  Take a moment to let us know how we did to meet your needs and what we can do to improve.    Watch closely for the signs of infection: Redness; drainage/pus; warmth at the site or general fever; swelling; red streaks; increasing pain.  Return to the clinic or go to the emergency room if the symptoms develop.    Please fill your prescriptions and take all medications as directed/prescribed even if you begin to feel better or before you run out of the medicine.  Risks and benefits of medication were discussed during your visit.    Soak your finger in hot soapy water 3 times a day.  Use use antibacterial soap in your soaks

## 2021-08-03 NOTE — Progress Notes (Signed)
[facility]  PROGRESS NOTE     Patient: Kelli Chen   Date: 08/03/2021   MRN: [id number]       Kelli Chen is a 61 y.o. female      HISTORY     History obtained from: Patient    Chief Complaint   Patient presents with    Hand Pain     Pt got thorn stuck in her R ring finger she was able to remove the throne but a few days later her finger swell up and is now painful to the touch         Patient is here today for wound to her right ring finger.  10 days ago she was gardening and a thorn poked in the side of her cuticle.  3 days ago she began to have swelling and pain to the area.  There is now red hot.  She denies drainage and fever at this time.  She does not have history of diabetes.  She is not up-to-date on her tetanus.  She rates the pain 6 out of 10 when she tries to move her fingers.      History provided by:  Patient  Language interpreter used: No    Hand Pain   Pertinent negatives include no chest pain or numbness.        Review of Systems   Constitutional:  Negative for chills, diaphoresis and fever.   HENT:  Negative for congestion and sore throat.    Respiratory:  Negative for cough and shortness of breath.    Cardiovascular:  Negative for chest pain.   Gastrointestinal:  Negative for nausea and vomiting.   Musculoskeletal:  Negative for arthralgias, back pain, myalgias and neck pain.   Skin:  Positive for color change and wound. Negative for rash.   Allergic/Immunologic: Negative for environmental allergies.   Neurological:  Negative for light-headedness, numbness and headaches.   Hematological:  Negative for adenopathy.   Psychiatric/Behavioral:  Negative for confusion.        History:    Pertinent Past Medical, Surgical, Family and Social History were reviewed.        Current Outpatient Medications:     atorvastatin (LIPITOR) 10 MG tablet, , Disp: , Rfl:     losartan-hydrochlorothiazide (HYZAAR) 50-12.5 MG per tablet, Take 1 tablet by mouth every morning, Disp: , Rfl:     mupirocin (BACTROBAN) 2  % ointment, Apply topically 3 (three) times daily, Disp: 15 g, Rfl: 0    sulfamethoxazole-trimethoprim (BACTRIM DS) 800-160 MG per tablet, Take 1 tablet by mouth 2 (two) times daily for 7 days, Disp: 14 tablet, Rfl: 0    No Known Allergies    Medications and Allergies reviewed.    PHYSICAL EXAM     Vitals:    08/03/21 1400   BP: 116/75   Pulse: 74   Resp: 16   Temp: 98.1 F (36.7 C)   SpO2: 99%       Physical Exam  Constitutional:       General: She is not in acute distress.     Appearance: She is well-developed.   HENT:      Head: Normocephalic and atraumatic.     Eyes: Conjunctivae are normal. Cardiovascular:      Rate and Rhythm: Normal rate and regular rhythm.   Pulmonary:      Effort: Pulmonary effort is normal.      Breath sounds: Normal breath sounds.   Neurological:  Mental Status: She is alert and oriented to person, place, and time.   Skin:     General: Skin is warm.          Vitals and nursing note reviewed.         UCC COURSE     There were no labs reviewed with this patient during the visit.    There were no x-rays reviewed with this patient during the visit.    No current facility-administered medications for this visit.       PROCEDURES     Procedures    MEDICAL DECISION MAKING     History, physical, labs/studies most consistent with paronychia as the diagnosis.    Chart Review:  Prior PCP, Specialist and/or ED notes reviewed today: No  Prior labs/images/studies reviewed today: No    Differential Diagnosis: ingrown fingernail, foreign body, MRSA infection, fungal infection of nail    ASSESSMENT     Encounter Diagnosis   Name Primary?    Paronychia of finger, right Yes                PLAN      PLAN: Well appearing, VSS, non toxic, stable for dispo home  ED precautions reviewed  Follow up with PCP  Patient verbalized understanding of plan of care and had no further questions/concerns              Orders Placed This Encounter   Procedures    Tdap vaccine greater than or equal to 7yo IM     Requested  Prescriptions     Signed Prescriptions Disp Refills    mupirocin (BACTROBAN) 2 % ointment 15 g 0     Sig: Apply topically 3 (three) times daily    sulfamethoxazole-trimethoprim (BACTRIM DS) 800-160 MG per tablet 14 tablet 0     Sig: Take 1 tablet by mouth 2 (two) times daily for 7 days       Discussed results and diagnosis with patient/family.  Reviewed warning signs for worsening condition, as well as, indications for follow-up with primary care physician and return to urgent care clinic.   Patient/family expressed understanding of instructions.     An After Visit Summary was provided to the patient.

## 2021-09-24 ENCOUNTER — Other Ambulatory Visit: Payer: Self-pay | Admitting: Adult Health

## 2021-10-09 ENCOUNTER — Other Ambulatory Visit: Payer: Self-pay | Admitting: Adult Health

## 2021-11-16 ENCOUNTER — Other Ambulatory Visit: Payer: Self-pay | Admitting: Adult Health

## 2021-11-27 ENCOUNTER — Other Ambulatory Visit (INDEPENDENT_AMBULATORY_CARE_PROVIDER_SITE_OTHER): Payer: Self-pay | Admitting: Adult Health

## 2022-04-02 ENCOUNTER — Other Ambulatory Visit: Payer: Self-pay | Admitting: Adult Health

## 2022-10-25 ENCOUNTER — Other Ambulatory Visit: Payer: Self-pay | Admitting: Adult Health

## 2022-11-26 ENCOUNTER — Other Ambulatory Visit: Payer: Self-pay | Admitting: Adult Health

## 2023-02-28 ENCOUNTER — Other Ambulatory Visit (FREE_STANDING_LABORATORY_FACILITY): Payer: BC Managed Care – PPO | Admitting: Gastroenterology

## 2023-02-28 DIAGNOSIS — K635 Polyp of colon: Secondary | ICD-10-CM

## 2023-02-28 DIAGNOSIS — Z860100 Personal history of colon polyps, unspecified: Secondary | ICD-10-CM

## 2023-02-28 DIAGNOSIS — R1013 Epigastric pain: Secondary | ICD-10-CM

## 2023-02-28 DIAGNOSIS — K2970 Gastritis, unspecified, without bleeding: Secondary | ICD-10-CM

## 2023-02-28 DIAGNOSIS — Z800 Family history of malignant neoplasm of digestive organs: Secondary | ICD-10-CM

## 2023-03-06 LAB — SURGICAL PATHOLOGY

## 2023-10-10 ENCOUNTER — Emergency Department

## 2023-10-10 ENCOUNTER — Emergency Department
Admission: EM | Admit: 2023-10-10 | Discharge: 2023-10-10 | Disposition: A | Discharge status: Home / Self Care | Attending: Emergency Medicine | Admitting: Emergency Medicine

## 2023-10-10 DIAGNOSIS — S8392XA Sprain of unspecified site of left knee, initial encounter: Secondary | ICD-10-CM | POA: Insufficient documentation

## 2023-10-10 DIAGNOSIS — X500XXA Overexertion from strenuous movement or load, initial encounter: Secondary | ICD-10-CM | POA: Insufficient documentation

## 2023-10-10 MED ORDER — NAPROXEN 500 MG PO TABS
500.0000 mg | ORAL_TABLET | Freq: Two times a day (BID) | ORAL | 0 refills | Status: AC | PRN
Start: 2023-10-10 — End: ?

## 2023-10-10 NOTE — ED Provider Notes (Signed)
 [facility] EMERGENCY DEPARTMENT ATTENDING PHYSICIAN NOTE     CLINICAL SUMMARY          Diagnosis:    .     Final diagnoses:   Sprain of left knee, unspecified ligament, initial encounter         Disposition:       ED Disposition       ED Disposition   Discharge    Condition   --    Date/Time   Fri Oct 10, 2023  8:31 PM    Comment   [name] discharge to home/self care.    Condition at disposition: Stable                    Discharge Prescriptions       Medication Sig Dispense Auth. Provider    naproxen  (NAPROSYN ) 500 MG tablet Take 1 tablet (500 mg) by mouth every 12 (twelve) hours as needed (pain) 10 tablet Braxton Isles, MD                                              CLINICAL INFORMATION        HPI:         63 y.o. female p/w medial left knee pain  started after she twisted it  no blunt trauma  no pain in thigh or lower leg  no other injuries    History obtained from:  Patient           ROS:      Pertinent Positives and Negatives noted in the HPI.    All other systems reviewed and are negative        Physical Exam:      Pulse 81  BP 153/90  Resp 18  SpO2 96 %  Temp 98.8 F (37.1 C)    Nursing note and vitals reviewed.    Constitutional:  Comfortable. Full sentences.   Psych:  Normal affect. Cooperative.   Eyes: No conjunctival injection. No discharge.  Ears, Nose, Mouth and Throat:Tolerating secretions.  No hoarseness.  Respiratory: No respiratory distress. No retractions.    Cardiovascular:    Abdomen:   Musculoskeletal:          Neck:           Back:            Upper Extremity:           Lower Extremity: LEFT knee:  no bony tenderness. no laxity of the joint. no effusion.  no ttp to thigh or lower leg.  well-perfused  Neurological:   Integumentary:   GU:   Lymphatic:                PAST HISTORY        Primary Care Provider: Pcp, None, MD        PMH/PSH:      Kelli Chen has a past medical history of Hyperlipidemia and Hypertension.  She has no past surgical history on file.      Social/Family  History:      She reports that she has never smoked. She has never used smokeless tobacco. She reports that she does not currently use drugs. No history on file for alcohol use.  Her family history is not on file.      Listed Medications on Arrival:    .  Home Medications       Med List Status: In Progress Set By: Lebron Shaver, RN at 10/10/2023  8:10 PM              atorvastatin (LIPITOR) 10 MG tablet          losartan-hydrochlorothiazide (HYZAAR) 50-12.5 MG per tablet     Take 1 tablet by mouth every morning     mupirocin  (BACTROBAN ) 2 % ointment     Apply topically 3 (three) times daily          Allergies: She has no known allergies.            VISIT INFORMATION        Medical Decision Making & Clinical Course:      I reviewed the vital signs, nursing notes, past medical history, past surgical history, family history and social history.    At 8:37 PM:  63 y.o. female p/w medial L knee pain after a twisting injury.  the knee is stable w/ no bony tenderness. she only has pain when she walks, none w/ sitting.  x-ray of L knee shows no fracture and no dislocation.  clinically, this is a sprain. will d/c as such w/ ace wrap, nsaids and orthopedic surgery f/u.   pt a/w plan.     Splint Procedure Note:  The ace wrap was applied by the RN.  I personally supervised and inspected the splint.  The extremity was appropriately immobilized.  Patient was neurovascularly intact before and after the splint application.                        Medical Decision Making  Problems Addressed:  Sprain of left knee, unspecified ligament, initial encounter: acute illness or injury that poses a threat to life or bodily functions    Amount and/or Complexity of Data Reviewed  Radiology: ordered and independent interpretation performed. Decision-making details documented in ED Course.    Risk  Prescription drug management.          Medications Given in the ED:    .     ED Medication Orders (From admission, onward)      None               Procedures:      Procedures      Interpretations:                RESULTS        Lab Results:               Radiology Results:      Knee 4+ Views Left   Final Result      No acute osseous abnormality.      Kelli Brought MD   10/10/2023 8:27 PM                  Braxton Isles, MD  10/10/23 2040

## 2023-10-10 NOTE — Discharge Instructions (Signed)
 Dear Kelli Chen:    Before leaving the emergency department, please check with registration to make sure we have an up-to-date telephone number for you.  Certain laboratory and radiology tests do not result during your visit.  We will contact you by telephone if any of these tests are abnormal.      If you do not continue to improve or if your condition worsens, contact your primary doctor or return to the emergency department for a re-evaluation.      Take these papers to all of your follow-up appointments.  Enclosed you will find your test results from today's visit.     Thank you for choosing the [facility] Emergency Department for your healthcare needs.     Eligio Greener, MD, MPH  Attending Physician  Department of Emergency Medicine
